# Patient Record
Sex: Male | Born: 1945
Health system: Southern US, Community
[De-identification: ages and names within clinical notes are randomized; demographics above are authoritative.]

## PROBLEM LIST (undated history)

## (undated) DIAGNOSIS — E7889 Other lipoprotein metabolism disorders: Secondary | ICD-10-CM

## (undated) DIAGNOSIS — J45909 Unspecified asthma, uncomplicated: Secondary | ICD-10-CM

## (undated) DIAGNOSIS — R972 Elevated prostate specific antigen [PSA]: Secondary | ICD-10-CM

## (undated) DIAGNOSIS — J309 Allergic rhinitis, unspecified: Secondary | ICD-10-CM

## (undated) DIAGNOSIS — E790 Hyperuricemia without signs of inflammatory arthritis and tophaceous disease: Secondary | ICD-10-CM

## (undated) DIAGNOSIS — E7800 Pure hypercholesterolemia, unspecified: Secondary | ICD-10-CM

## (undated) DIAGNOSIS — I10 Essential (primary) hypertension: Secondary | ICD-10-CM

## (undated) DIAGNOSIS — M109 Gout, unspecified: Secondary | ICD-10-CM

## (undated) DIAGNOSIS — K219 Gastro-esophageal reflux disease without esophagitis: Secondary | ICD-10-CM

## (undated) DIAGNOSIS — K4020 Bilateral inguinal hernia, without obstruction or gangrene, not specified as recurrent: Secondary | ICD-10-CM

## (undated) HISTORY — DX: Gout, unspecified: M10.9

## (undated) HISTORY — DX: Essential (primary) hypertension: I10

## (undated) HISTORY — DX: Gastro-esophageal reflux disease without esophagitis: K21.9

## (undated) HISTORY — DX: Pure hypercholesterolemia, unspecified: E78.00

## (undated) HISTORY — DX: Hyperuricemia without signs of inflammatory arthritis and tophaceous disease: E79.0

## (undated) HISTORY — DX: Other lipoprotein metabolism disorders: E78.89

## (undated) HISTORY — DX: Bilateral inguinal hernia, without obstruction or gangrene, not specified as recurrent: K40.20

## (undated) HISTORY — DX: Allergic rhinitis, unspecified: J30.9

## (undated) HISTORY — DX: Elevated prostate specific antigen (PSA): R97.20

## (undated) HISTORY — DX: Unspecified asthma, uncomplicated: J45.909

---

## 2004-06-12 ENCOUNTER — Ambulatory Visit (HOSPITAL_COMMUNITY): Admission: RE | Admit: 2004-06-12 | Discharge: 2004-06-12 | Payer: Self-pay | Admitting: Gastroenterology

## 2008-12-20 ENCOUNTER — Ambulatory Visit: Admission: RE | Admit: 2008-12-20 | Discharge: 2009-01-05 | Payer: Self-pay | Admitting: Radiation Oncology

## 2009-01-03 ENCOUNTER — Encounter: Admission: RE | Admit: 2009-01-03 | Discharge: 2009-01-03 | Payer: Self-pay | Admitting: Urology

## 2009-01-06 HISTORY — PX: INSERTION PROSTATE RADIATION SEED: SUR718

## 2009-01-10 ENCOUNTER — Ambulatory Visit: Admission: RE | Admit: 2009-01-10 | Discharge: 2009-03-15 | Payer: Self-pay | Admitting: Radiation Oncology

## 2009-02-05 ENCOUNTER — Ambulatory Visit (HOSPITAL_BASED_OUTPATIENT_CLINIC_OR_DEPARTMENT_OTHER): Admission: RE | Admit: 2009-02-05 | Discharge: 2009-02-05 | Payer: Self-pay | Admitting: Urology

## 2010-03-25 LAB — COMPREHENSIVE METABOLIC PANEL
Albumin: 4.2 g/dL (ref 3.5–5.2)
BUN: 19 mg/dL (ref 6–23)
Calcium: 9.4 mg/dL (ref 8.4–10.5)
Chloride: 101 mEq/L (ref 96–112)
Creatinine, Ser: 1.15 mg/dL (ref 0.4–1.5)
GFR calc non Af Amer: >60 mL/min (ref >60)
Total Bilirubin: 1.4 mg/dL — ABNORMAL HIGH (ref 0.3–1.2)

## 2010-03-25 LAB — PROTIME-INR: INR: 1 (ref 0.00–1.49)

## 2010-03-25 LAB — CBC
HCT: 45.6 % (ref 39.0–52.0)
MCHC: 33.8 g/dL (ref 30.0–36.0)
MCV: 96 fL (ref 78.0–100.0)
Platelets: 216 10*3/uL (ref 150–400)
WBC: 6.9 10*3/uL (ref 4.0–10.5)

## 2010-03-25 LAB — APTT: aPTT: 29 seconds (ref 24–37)

## 2010-05-24 NOTE — Op Note (Signed)
NAME:  Mathew Green, Mathew Green NO.:  [id number]   MEDICAL RECORD NO.:  [id number]          PATIENT TYPE:  AMB   LOCATION:  ENDO                         FACILITY:  [facility]   PHYSICIAN:  John C. Madilyn Fireman, M.D.    DATE OF BIRTH:  10/06/1945   DATE OF PROCEDURE:  DATE OF DISCHARGE:                                 OPERATIVE REPORT   INDICATIONS FOR PROCEDURE:  Average risk colon cancer screening.   PROCEDURE:  The patient was placed in the left lateral decubitus position  and placed on the pulse monitor with continuous low-flow oxygen delivered by  nasal cannula.  He was sedated with 75 mcg of IV fentanyl and 5 mg of IV  Versed. Olympus video colonoscope was inserted into the rectum and advanced  to the cecum, confirmed by transillumination at McBurney's point and  visualization of the ileocecal valve and appendiceal orifice. The prep was  good. The cecum and ascending colon appeared normal with no masses, polyps,  diverticula or other mucosal abnormalities. In the transverse, descending  and sigmoid colon, there were significant scattered diverticula, no other  abnormalities.  The rectum appeared normal on retroflexed view.  The anus  revealed no obvious internal hemorrhoids. Scope was then withdrawn and the  patient returned to the recovery room in stable condition. He tolerated the  procedure well and there were no immediate complications.   IMPRESSION:  Diverticulosis, otherwise normal study.   PLAN:  Next colonoscopy within 10 years and consider flexible sigmoidoscopy  and/or Hemoccults in five years.      [facility]/MEDQ  D:  06/12/2004  T:  06/12/2004  Job:  [id number]   cc:   Oley Balm. Georgina Pillion, M.D.  [address]  [place]  [place] [address]  Fax: [phone]

## 2011-01-16 ENCOUNTER — Encounter: Payer: Self-pay | Admitting: *Deleted

## 2011-01-16 NOTE — Progress Notes (Signed)
On  12/15/210 I-PSS results=[id number] score=8

## 2014-02-01 DIAGNOSIS — H25099 Other age-related incipient cataract, unspecified eye: Secondary | ICD-10-CM | POA: Diagnosis not present

## 2014-02-20 ENCOUNTER — Encounter: Payer: Self-pay | Admitting: *Deleted

## 2014-08-09 DIAGNOSIS — K635 Polyp of colon: Secondary | ICD-10-CM | POA: Diagnosis not present

## 2014-08-09 DIAGNOSIS — K5730 Diverticulosis of large intestine without perforation or abscess without bleeding: Secondary | ICD-10-CM | POA: Diagnosis not present

## 2014-08-09 DIAGNOSIS — Z1211 Encounter for screening for malignant neoplasm of colon: Secondary | ICD-10-CM | POA: Diagnosis not present

## 2014-08-09 DIAGNOSIS — K6289 Other specified diseases of anus and rectum: Secondary | ICD-10-CM | POA: Diagnosis not present

## 2014-10-03 DIAGNOSIS — C61 Malignant neoplasm of prostate: Secondary | ICD-10-CM | POA: Diagnosis not present

## 2014-10-10 DIAGNOSIS — Z8546 Personal history of malignant neoplasm of prostate: Secondary | ICD-10-CM | POA: Diagnosis not present

## 2019-02-06 ENCOUNTER — Ambulatory Visit: Payer: Self-pay

## 2019-02-08 ENCOUNTER — Other Ambulatory Visit: Payer: Medicare PPO

## 2019-02-13 ENCOUNTER — Ambulatory Visit: Payer: Medicare PPO | Attending: Internal Medicine

## 2019-02-13 DIAGNOSIS — Z23 Encounter for immunization: Secondary | ICD-10-CM | POA: Insufficient documentation

## 2019-02-13 NOTE — Progress Notes (Signed)
   Covid-19 Vaccination Clinic  Name:  Mathew Green    MRN: [id number] DOB: 08-18-1945  02/13/2019  Mathew Green was observed post Covid-19 immunization for 15 minutes without incidence. He was provided with Vaccine Information Sheet and instruction to access the V-Safe system.   Mathew Green was instructed to call 911 with any severe reactions post vaccine: Marland Kitchen Difficulty breathing  . Swelling of your face and throat  . A fast heartbeat  . A bad rash all over your body  . Dizziness and weakness    Immunizations Administered    Name Date Dose VIS Date Route   Pfizer COVID-19 Vaccine 02/13/2019  8:41 AM 0.3 mL 12/17/2018 Intramuscular   Manufacturer: [employer], [employer]   Lot: [id number]   NDC: [address]-8

## 2019-03-09 ENCOUNTER — Ambulatory Visit: Payer: Medicare PPO | Attending: Internal Medicine

## 2019-03-09 DIAGNOSIS — Z23 Encounter for immunization: Secondary | ICD-10-CM | POA: Insufficient documentation

## 2019-03-09 NOTE — Progress Notes (Signed)
   Covid-19 Vaccination Clinic  Name:  Mathew Green    MRN: [id number] DOB: 08-04-1945  03/09/2019  [name] was observed post Covid-19 immunization for 15 minutes without incident. He was provided with Vaccine Information Sheet and instruction to access the V-Safe system.   [name] was instructed to call 911 with any severe reactions post vaccine: Marland Kitchen Difficulty breathing  . Swelling of face and throat  . A fast heartbeat  . A bad rash all over body  . Dizziness and weakness   Immunizations Administered    Name Date Dose VIS Date Route   Pfizer COVID-19 Vaccine 03/09/2019  3:54 PM 0.3 mL 12/17/2018 Intramuscular   Manufacturer: [employer], [employer]   Lot: [id number]   NDC: [address]-2

## 2019-12-05 DIAGNOSIS — Z8546 Personal history of malignant neoplasm of prostate: Secondary | ICD-10-CM | POA: Diagnosis not present

## 2019-12-12 DIAGNOSIS — N320 Bladder-neck obstruction: Secondary | ICD-10-CM | POA: Diagnosis not present

## 2019-12-12 DIAGNOSIS — Z8546 Personal history of malignant neoplasm of prostate: Secondary | ICD-10-CM | POA: Diagnosis not present

## 2020-05-09 DIAGNOSIS — J4520 Mild intermittent asthma, uncomplicated: Secondary | ICD-10-CM | POA: Diagnosis not present

## 2020-05-09 DIAGNOSIS — M109 Gout, unspecified: Secondary | ICD-10-CM | POA: Diagnosis not present

## 2020-05-09 DIAGNOSIS — J301 Allergic rhinitis due to pollen: Secondary | ICD-10-CM | POA: Diagnosis not present

## 2020-05-09 DIAGNOSIS — Z79899 Other long term (current) drug therapy: Secondary | ICD-10-CM | POA: Diagnosis not present

## 2020-05-09 DIAGNOSIS — E7800 Pure hypercholesterolemia, unspecified: Secondary | ICD-10-CM | POA: Diagnosis not present

## 2020-05-09 DIAGNOSIS — I10 Essential (primary) hypertension: Secondary | ICD-10-CM | POA: Diagnosis not present

## 2020-05-09 DIAGNOSIS — Z0001 Encounter for general adult medical examination with abnormal findings: Secondary | ICD-10-CM | POA: Diagnosis not present

## 2020-05-23 DIAGNOSIS — Z79899 Other long term (current) drug therapy: Secondary | ICD-10-CM | POA: Diagnosis not present

## 2020-06-12 DIAGNOSIS — R310 Gross hematuria: Secondary | ICD-10-CM | POA: Diagnosis not present

## 2020-06-12 DIAGNOSIS — I10 Essential (primary) hypertension: Secondary | ICD-10-CM | POA: Diagnosis not present

## 2020-06-26 DIAGNOSIS — R310 Gross hematuria: Secondary | ICD-10-CM | POA: Diagnosis not present

## 2020-07-06 DIAGNOSIS — N281 Cyst of kidney, acquired: Secondary | ICD-10-CM | POA: Diagnosis not present

## 2020-07-06 DIAGNOSIS — R310 Gross hematuria: Secondary | ICD-10-CM | POA: Diagnosis not present

## 2020-07-06 DIAGNOSIS — N200 Calculus of kidney: Secondary | ICD-10-CM | POA: Diagnosis not present

## 2020-07-13 DIAGNOSIS — I10 Essential (primary) hypertension: Secondary | ICD-10-CM | POA: Diagnosis not present

## 2020-07-23 DIAGNOSIS — H2513 Age-related nuclear cataract, bilateral: Secondary | ICD-10-CM | POA: Diagnosis not present

## 2020-07-23 DIAGNOSIS — H524 Presbyopia: Secondary | ICD-10-CM | POA: Diagnosis not present

## 2020-07-23 DIAGNOSIS — H52223 Regular astigmatism, bilateral: Secondary | ICD-10-CM | POA: Diagnosis not present

## 2020-07-23 DIAGNOSIS — H5203 Hypermetropia, bilateral: Secondary | ICD-10-CM | POA: Diagnosis not present

## 2020-12-26 ENCOUNTER — Other Ambulatory Visit: Payer: Self-pay

## 2020-12-26 ENCOUNTER — Encounter (HOSPITAL_COMMUNITY): Payer: Self-pay

## 2020-12-26 ENCOUNTER — Emergency Department (HOSPITAL_COMMUNITY)
Admission: EM | Admit: 2020-12-26 | Discharge: 2020-12-26 | Disposition: A | Discharge status: Home / Self Care | Payer: Medicare PPO | Attending: Emergency Medicine | Admitting: Emergency Medicine

## 2020-12-26 ENCOUNTER — Emergency Department (HOSPITAL_COMMUNITY): Payer: Medicare PPO

## 2020-12-26 DIAGNOSIS — Z23 Encounter for immunization: Secondary | ICD-10-CM | POA: Insufficient documentation

## 2020-12-26 DIAGNOSIS — I10 Essential (primary) hypertension: Secondary | ICD-10-CM | POA: Diagnosis not present

## 2020-12-26 DIAGNOSIS — Z87891 Personal history of nicotine dependence: Secondary | ICD-10-CM | POA: Diagnosis not present

## 2020-12-26 DIAGNOSIS — J45909 Unspecified asthma, uncomplicated: Secondary | ICD-10-CM | POA: Diagnosis not present

## 2020-12-26 DIAGNOSIS — W010XXA Fall on same level from slipping, tripping and stumbling without subsequent striking against object, initial encounter: Secondary | ICD-10-CM | POA: Diagnosis not present

## 2020-12-26 DIAGNOSIS — S81812A Laceration without foreign body, left lower leg, initial encounter: Secondary | ICD-10-CM

## 2020-12-26 DIAGNOSIS — Z7982 Long term (current) use of aspirin: Secondary | ICD-10-CM | POA: Insufficient documentation

## 2020-12-26 DIAGNOSIS — Z79899 Other long term (current) drug therapy: Secondary | ICD-10-CM | POA: Diagnosis not present

## 2020-12-26 DIAGNOSIS — Z7952 Long term (current) use of systemic steroids: Secondary | ICD-10-CM | POA: Insufficient documentation

## 2020-12-26 DIAGNOSIS — W19XXXA Unspecified fall, initial encounter: Secondary | ICD-10-CM

## 2020-12-26 DIAGNOSIS — S8992XA Unspecified injury of left lower leg, initial encounter: Secondary | ICD-10-CM | POA: Diagnosis present

## 2020-12-26 MED ORDER — CEPHALEXIN 500 MG PO CAPS: 500.0000 mg | ORAL_CAPSULE | Freq: Two times a day (BID) | ORAL | 0 refills | Status: DC

## 2020-12-26 MED ORDER — CEPHALEXIN 500 MG PO CAPS: 500.0000 mg | ORAL_CAPSULE | Freq: Two times a day (BID) | ORAL | 0 refills | Status: AC

## 2020-12-26 MED ORDER — LIDOCAINE-EPINEPHRINE (PF) 2 %-1:200000 IJ SOLN
20.0000 mL | Freq: Once | INTRAMUSCULAR | Status: AC
  Administered 2020-12-26: 20 mL
  Filled 2020-12-26: qty 20

## 2020-12-26 MED ORDER — TETANUS-DIPHTH-ACELL PERTUSSIS 5-2.5-18.5 LF-MCG/0.5 IM SUSY
0.5000 mL | PREFILLED_SYRINGE | Freq: Once | INTRAMUSCULAR | Status: AC
  Administered 2020-12-26: 21:00:00 0.5 mL via INTRAMUSCULAR
  Filled 2020-12-26: qty 0.5

## 2020-12-26 NOTE — ED Triage Notes (Addendum)
Pt reports he tripped over a ledge in a parking garage tonight and presents with large gash to left anterior shin, mild bleeding but is controlled. No blood thinners. Ambulatory. No LOC. No head injury. Denies pain anywhere else.

## 2020-12-26 NOTE — ED Provider Notes (Addendum)
[facility]-EMERGENCY DEPT Provider Note   CSN: [id number] Arrival date & time: 12/26/20  2027     History Chief Complaint  Patient presents with   Mathew Green is a 75 y.o. male with no significant past medical history who presents for evaluation of mechanical fall.  Went to take his leg up and tripped.  Did not actually fall to the ground however did suffer large laceration to left anterior shin.  Mild oozing of blood.  He is not anticoagulated.  He has been ambulatory since.  Denies hitting his head, LOC anticoagulation.  No paresthesias, weakness.  No pain to bilateral femur, knee, ankle or feet.  Rates his pain a 3/10.  Denies additional aggravating or relieving factors  History obtained from patient and past medical records.  No interpreter is used.  HPI     Past Medical History:  Diagnosis Date   Allergic sinusitis    Asthma    Bilateral inguinal hernia    Elevated HDL    Elevated LDL cholesterol level    Elevated PSA    stage T1C, favorable risk adenocarcinoma of the prostate   GERD (gastroesophageal reflux disease)    Gout    Hypertension    Hyperuricemia     There are no problems to display for this patient.   Past Surgical History:  Procedure Laterality Date   INSERTION PROSTATE RADIATION SEED  2011       Family History  Problem Relation Age of Onset   CAD Father    Ovarian cancer Mother    CVA Maternal Grandfather    Healthy Sister     Social History   Tobacco Use   Smoking status: Former    Types: Cigarettes   Smokeless tobacco: Never  Substance Use Topics   Alcohol use: Yes   Drug use: Never    Home Medications Prior to Admission medications   Medication Sig Start Date End Date Taking? Authorizing Provider  albuterol (PROVENTIL HFA;VENTOLIN HFA) 108 (90 BASE) MCG/ACT inhaler Inhale into the lungs every 6 (six) hours as needed for wheezing or shortness of breath.    [provider]  amLODipine  (NORVASC) 2.5 MG tablet Take 2.5 mg by mouth daily.    [provider]  aspirin 81 MG tablet Take 81 mg by mouth daily.    [provider]  atorvastatin (LIPITOR) 10 MG tablet Take 10 mg by mouth daily.    [provider]  cephALEXin (KEFLEX) 500 MG capsule Take 1 capsule (500 mg total) by mouth 2 (two) times daily for 7 days. 12/26/20 01/02/21 Yes Edwards Mckelvie A, PA-C  fexofenadine (ALLEGRA) 180 MG tablet Take 180 mg by mouth daily.    [provider]  fluticasone (FLONASE) 50 MCG/ACT nasal spray Place into both nostrils daily.    [provider]  Fluticasone-Salmeterol (ADVAIR) 100-50 MCG/DOSE AEPB Inhale 1 puff into the lungs 2 (two) times daily.    [provider]  losartan-hydrochlorothiazide (HYZAAR) 100-25 MG per tablet Take 1 tablet by mouth daily.    [provider]  MULTIPLE VITAMIN PO Take by mouth.    [provider]  Omeprazole 20 MG TBEC Take by mouth.    [provider]    Allergies    Pravastatin and Vicodin [hydrocodone-acetaminophen]  Review of Systems   Review of Systems  Constitutional: Negative.   HENT: Negative.    Respiratory: Negative.    Cardiovascular: Negative.   Gastrointestinal:  Negative.   Genitourinary: Negative.   Musculoskeletal:        Left tib fib pain  Skin:  Positive for wound.  Neurological: Negative.   All other systems reviewed and are negative.  Physical Exam Updated Vital Signs BP (!) 175/86 (BP Location: Left Arm)    Pulse 73    Temp 98.1 F (36.7 C) (Oral)    Resp 16    Ht 6\' 2"  (1.88 m)    Wt 88.5 kg    SpO2 97%    BMI 25.04 kg/m   Physical Exam Vitals and nursing note reviewed.  Constitutional:      General: He is not in acute distress.    Appearance: He is well-developed. He is not ill-appearing, toxic-appearing or diaphoretic.  HENT:     Head: Normocephalic and atraumatic.  Eyes:     Pupils: Pupils are equal, round, and reactive to light.   Cardiovascular:     Rate and Rhythm: Normal rate and regular rhythm.     Pulses: Normal pulses.          Dorsalis pedis pulses are 2+ on the right side and 2+ on the left side.       Posterior tibial pulses are 2+ on the right side and 2+ on the left side.     Heart sounds: Normal heart sounds.  Pulmonary:     Effort: Pulmonary effort is normal. No respiratory distress.  Abdominal:     General: There is no distension.     Palpations: Abdomen is soft.  Musculoskeletal:        General: Normal range of motion.     Cervical back: Normal range of motion and neck supple.     Thoracic back: Normal.     Lumbar back: Normal.     Right hip: Normal.     Left hip: Normal.     Right upper leg: Normal.     Left upper leg: Normal.     Right knee: Normal.     Left knee: Normal.     Right lower leg: Normal.     Left lower leg: Laceration and tenderness present. No deformity.     Right ankle: Normal.     Left ankle: Normal.     Right foot: Normal.     Left foot: Normal.       Legs:     Comments: Mild tenderness, midshaft tib-fib.  Non-tender bilateral femur, pelvis, knee, ankle and foot.  No shortening or rotation of leg  Skin:    General: Skin is warm and dry.     Capillary Refill: Capillary refill takes less than 2 seconds.     Comments: 6 cm gaping laceration left midshaft tib-fib.  No pulsatile bleeding.  Neurological:     General: No focal deficit present.     Mental Status: He is alert and oriented to person, place, and time.     Comments: Ambulatory without difficulty Intact sensation Equal strength       ED Results / Procedures / Treatments   Labs (all labs ordered are listed, but only abnormal results are displayed) Labs Reviewed - No data to display  EKG None  Radiology DG Tibia/Fibula Left  Result Date: 12/26/2020 CLINICAL DATA:  Fall, laceration. EXAM: LEFT TIBIA AND FIBULA - 2 VIEW COMPARISON:  None. FINDINGS: There is soft tissue laceration of the mid anterior  lower extremity. There is no radiopaque foreign body. There is no acute fracture or dislocation. Joint spaces are  maintained. IMPRESSION: 1. Soft tissue laceration of the mid lower extremity. No foreign body. 2. No acute fracture or dislocation. Electronically Signed   By: Darliss Cheney M.D.   On: 12/26/2020 21:31    Procedures .Marland KitchenLaceration Repair  Date/Time: 12/26/2020 9:59 PM Performed by: Ralph Leyden A, PA-C Authorized by: Linwood Dibbles, PA-C   Consent:    Consent obtained:  Verbal   Consent given by:  Patient   Risks, benefits, and alternatives were discussed: yes     Risks discussed:  Infection, pain, poor wound healing, poor cosmetic result, need for additional repair, nerve damage, vascular damage, tendon damage and retained foreign body   Alternatives discussed:  No treatment, delayed treatment, observation and referral Universal protocol:    Procedure explained and questions answered to patient or proxy's satisfaction: yes     Relevant documents present and verified: yes     Test results available: yes     Imaging studies available: yes     Required blood products, implants, devices, and special equipment available: yes     Site/side marked: yes     Immediately prior to procedure, a time out was called: yes     Patient identity confirmed:  Verbally with patient Anesthesia:    Anesthesia method:  Local infiltration   Local anesthetic:  Lidocaine 1% WITH epi Laceration details:    Location:  Leg   Leg location:  L lower leg   Length (cm):  7   Depth (mm):  5 Pre-procedure details:    Preparation:  Patient was prepped and draped in usual sterile fashion and imaging obtained to evaluate for foreign bodies Exploration:    Limited defect created (wound extended): no     Hemostasis achieved with:  Direct pressure   Imaging obtained: x-ray     Imaging outcome: foreign body not noted     Wound exploration: wound explored through full range of motion and entire depth  of wound visualized     Wound extent: no foreign bodies/material noted, no muscle damage noted, no nerve damage noted, no tendon damage noted, no underlying fracture noted and no vascular damage noted     Contaminated: no   Treatment:    Area cleansed with:  Povidone-iodine   Amount of cleaning:  Extensive   Irrigation solution:  Sterile saline   Irrigation volume:  2L   Irrigation method:  Pressure wash   Debridement:  None   Undermining:  None Skin repair:    Repair method:  Staples   Number of staples:  13 Approximation:    Approximation:  Close Repair type:    Repair type:  Complex Post-procedure details:    Dressing:  Bulky dressing and non-adherent dressing   Procedure completion:  Tolerated well, no immediate complications   Medications Ordered in ED Medications  Tdap (BOOSTRIX) injection 0.5 mL (0.5 mLs Intramuscular Given 12/26/20 2106)  lidocaine-EPINEPHrine (XYLOCAINE W/EPI) 2 %-1:200000 (PF) injection 20 mL (20 mLs Infiltration Given 12/26/20 2106)   ED Course  I have reviewed the triage vital signs and the nursing notes.  Pertinent labs & imaging results that were available during my care of the patient were reviewed by me and considered in my medical decision making (see chart for details).  Pleasant 75 year old here for evaluation for mechanical fall.  Did not fall to the ground.  He is not in any blood thinners.  Ambulatory.  No paresthesias or weakness.  He has large laceration to left midshaft tib-fib.  No pulsatile  bleeding.  He is neurovascularly intact.  No obvious foreign body.  Will update tetanus.   X-ray shows no underlying fracture, dislocation or foreign object  Wound thoroughly irrigated. # 13 Staples placed. Encouraged wound care, follow-up outpatient, return for new or worsening symptoms.  Given depth, significance of wound did start on Keflex. Patient and wife are agreeable for follow up  No evidence of acute bony, tendon, ligament, vascular  injury.  The patient has been appropriately medically screened and/or stabilized in the ED. I have low suspicion for any other emergent medical condition which would require further screening, evaluation or treatment in the ED or require inpatient management.  Patient is hemodynamically stable and in no acute distress.  Patient able to ambulate in department prior to ED.  Evaluation does not show acute pathology that would require ongoing or additional emergent interventions while in the emergency department or further inpatient treatment.  I have discussed the diagnosis with the patient and answered all questions.  Pain is been managed while in the emergency department and patient has no further complaints prior to discharge.  Patient is comfortable with plan discussed in room and is stable for discharge at this time.  I have discussed strict return precautions for returning to the emergency department.  Patient was encouraged to follow-up with PCP/specialist refer to at discharge.     MDM Rules/Calculators/A&P                             Final Clinical Impression(s) / ED Diagnoses Final diagnoses:  Fall, initial encounter  Laceration of left lower extremity, initial encounter    Rx / DC Orders ED Discharge Orders          Ordered    cephALEXin (KEFLEX) 500 MG capsule  2 times daily        12/26/20 2159               Niles Ess A, PA-C 12/26/20 2216    Lorre Nick, MD 12/30/20 [phone]

## 2020-12-26 NOTE — Discharge Instructions (Addendum)
It was our pleasure taking care of you here in the emergency department today  You had 13 staples placed your leg  As discussed in the room after 24 hours you may remove the bandage  Let warm soapy water run over the wound.  Keep clean and dry.  I started on antibiotics, take as prescribed.  Return for new or worsening symptoms

## 2021-05-31 ENCOUNTER — Other Ambulatory Visit: Payer: Self-pay | Admitting: Family Medicine

## 2021-05-31 ENCOUNTER — Other Ambulatory Visit (HOSPITAL_COMMUNITY): Payer: Self-pay | Admitting: Family Medicine

## 2021-05-31 DIAGNOSIS — R0989 Other specified symptoms and signs involving the circulatory and respiratory systems: Secondary | ICD-10-CM | POA: Diagnosis not present

## 2021-05-31 DIAGNOSIS — Z79899 Other long term (current) drug therapy: Secondary | ICD-10-CM | POA: Diagnosis not present

## 2021-05-31 DIAGNOSIS — E7800 Pure hypercholesterolemia, unspecified: Secondary | ICD-10-CM | POA: Diagnosis not present

## 2021-05-31 DIAGNOSIS — J4520 Mild intermittent asthma, uncomplicated: Secondary | ICD-10-CM | POA: Diagnosis not present

## 2021-05-31 DIAGNOSIS — Z0000 Encounter for general adult medical examination without abnormal findings: Secondary | ICD-10-CM | POA: Diagnosis not present

## 2021-05-31 DIAGNOSIS — I10 Essential (primary) hypertension: Secondary | ICD-10-CM | POA: Diagnosis not present

## 2021-05-31 DIAGNOSIS — I951 Orthostatic hypotension: Secondary | ICD-10-CM | POA: Diagnosis not present

## 2021-05-31 DIAGNOSIS — M109 Gout, unspecified: Secondary | ICD-10-CM | POA: Diagnosis not present

## 2021-05-31 DIAGNOSIS — R7309 Other abnormal glucose: Secondary | ICD-10-CM | POA: Diagnosis not present

## 2021-06-06 ENCOUNTER — Ambulatory Visit (HOSPITAL_COMMUNITY)
Admission: RE | Admit: 2021-06-06 | Discharge: 2021-06-06 | Disposition: A | Discharge status: Home / Self Care | Payer: Medicare PPO | Source: Ambulatory Visit | Attending: Family Medicine | Admitting: Family Medicine

## 2021-06-06 DIAGNOSIS — E7800 Pure hypercholesterolemia, unspecified: Secondary | ICD-10-CM | POA: Insufficient documentation

## 2021-06-18 ENCOUNTER — Ambulatory Visit (HOSPITAL_COMMUNITY)
Admission: RE | Admit: 2021-06-18 | Discharge: 2021-06-18 | Disposition: A | Discharge status: Home / Self Care | Payer: Medicare PPO | Source: Ambulatory Visit | Attending: Family Medicine | Admitting: Family Medicine

## 2021-06-18 DIAGNOSIS — I340 Nonrheumatic mitral (valve) insufficiency: Secondary | ICD-10-CM

## 2021-06-18 DIAGNOSIS — R0989 Other specified symptoms and signs involving the circulatory and respiratory systems: Secondary | ICD-10-CM | POA: Diagnosis not present

## 2021-06-18 DIAGNOSIS — I351 Nonrheumatic aortic (valve) insufficiency: Secondary | ICD-10-CM

## 2021-06-18 DIAGNOSIS — I361 Nonrheumatic tricuspid (valve) insufficiency: Secondary | ICD-10-CM

## 2021-06-18 DIAGNOSIS — I10 Essential (primary) hypertension: Secondary | ICD-10-CM | POA: Diagnosis not present

## 2021-06-18 DIAGNOSIS — I080 Rheumatic disorders of both mitral and aortic valves: Secondary | ICD-10-CM | POA: Diagnosis not present

## 2021-06-18 LAB — ECHOCARDIOGRAM COMPLETE
AR max vel: 2.39 cm2
AV Area VTI: 2.41 cm2
AV Area mean vel: 2.25 cm2
AV Mean grad: 10.5 mmHg
AV Peak grad: 20 mmHg
Ao pk vel: 2.24 m/s
Area-P 1/2: 3.31 cm2
Calc EF: 63.7 %
P 1/2 time: 666 msec
S' Lateral: 3 cm
Single Plane A2C EF: 59.7 %
Single Plane A4C EF: 67.4 %

## 2021-07-12 DIAGNOSIS — Z79899 Other long term (current) drug therapy: Secondary | ICD-10-CM | POA: Diagnosis not present

## 2021-08-02 DIAGNOSIS — N1831 Chronic kidney disease, stage 3a: Secondary | ICD-10-CM | POA: Diagnosis not present

## 2021-08-02 DIAGNOSIS — I10 Essential (primary) hypertension: Secondary | ICD-10-CM | POA: Diagnosis not present

## 2021-08-02 DIAGNOSIS — J4520 Mild intermittent asthma, uncomplicated: Secondary | ICD-10-CM | POA: Diagnosis not present

## 2021-08-02 DIAGNOSIS — Z79899 Other long term (current) drug therapy: Secondary | ICD-10-CM | POA: Diagnosis not present

## 2021-08-02 DIAGNOSIS — M109 Gout, unspecified: Secondary | ICD-10-CM | POA: Diagnosis not present

## 2021-10-01 DIAGNOSIS — H5203 Hypermetropia, bilateral: Secondary | ICD-10-CM | POA: Diagnosis not present

## 2021-10-01 DIAGNOSIS — H2513 Age-related nuclear cataract, bilateral: Secondary | ICD-10-CM | POA: Diagnosis not present

## 2021-10-01 DIAGNOSIS — H52223 Regular astigmatism, bilateral: Secondary | ICD-10-CM | POA: Diagnosis not present

## 2021-10-01 DIAGNOSIS — Z135 Encounter for screening for eye and ear disorders: Secondary | ICD-10-CM | POA: Diagnosis not present

## 2021-10-01 DIAGNOSIS — H524 Presbyopia: Secondary | ICD-10-CM | POA: Diagnosis not present

## 2021-12-19 DIAGNOSIS — H2513 Age-related nuclear cataract, bilateral: Secondary | ICD-10-CM | POA: Diagnosis not present

## 2021-12-19 DIAGNOSIS — H25043 Posterior subcapsular polar age-related cataract, bilateral: Secondary | ICD-10-CM | POA: Diagnosis not present

## 2021-12-19 DIAGNOSIS — H18413 Arcus senilis, bilateral: Secondary | ICD-10-CM | POA: Diagnosis not present

## 2021-12-19 DIAGNOSIS — H35372 Puckering of macula, left eye: Secondary | ICD-10-CM | POA: Diagnosis not present

## 2021-12-19 DIAGNOSIS — H2512 Age-related nuclear cataract, left eye: Secondary | ICD-10-CM | POA: Diagnosis not present

## 2021-12-19 DIAGNOSIS — H25013 Cortical age-related cataract, bilateral: Secondary | ICD-10-CM | POA: Diagnosis not present

## 2022-03-05 DIAGNOSIS — H25812 Combined forms of age-related cataract, left eye: Secondary | ICD-10-CM | POA: Diagnosis not present

## 2022-03-05 DIAGNOSIS — H2512 Age-related nuclear cataract, left eye: Secondary | ICD-10-CM | POA: Diagnosis not present

## 2022-03-06 DIAGNOSIS — H2511 Age-related nuclear cataract, right eye: Secondary | ICD-10-CM | POA: Diagnosis not present

## 2022-03-19 DIAGNOSIS — H25811 Combined forms of age-related cataract, right eye: Secondary | ICD-10-CM | POA: Diagnosis not present

## 2022-03-19 DIAGNOSIS — H2511 Age-related nuclear cataract, right eye: Secondary | ICD-10-CM | POA: Diagnosis not present

## 2022-04-08 DIAGNOSIS — Z961 Presence of intraocular lens: Secondary | ICD-10-CM | POA: Diagnosis not present

## 2022-04-08 DIAGNOSIS — H52223 Regular astigmatism, bilateral: Secondary | ICD-10-CM | POA: Diagnosis not present

## 2022-04-29 DIAGNOSIS — H02055 Trichiasis without entropian left lower eyelid: Secondary | ICD-10-CM | POA: Diagnosis not present

## 2022-04-29 DIAGNOSIS — H02035 Senile entropion of left lower eyelid: Secondary | ICD-10-CM | POA: Diagnosis not present

## 2022-04-29 DIAGNOSIS — H02032 Senile entropion of right lower eyelid: Secondary | ICD-10-CM | POA: Diagnosis not present

## 2022-04-29 DIAGNOSIS — H02045 Spastic entropion of left lower eyelid: Secondary | ICD-10-CM | POA: Diagnosis not present

## 2022-04-29 DIAGNOSIS — H02535 Eyelid retraction left lower eyelid: Secondary | ICD-10-CM | POA: Diagnosis not present

## 2022-04-29 DIAGNOSIS — Z01818 Encounter for other preprocedural examination: Secondary | ICD-10-CM | POA: Diagnosis not present

## 2022-04-29 DIAGNOSIS — H0279 Other degenerative disorders of eyelid and periocular area: Secondary | ICD-10-CM | POA: Diagnosis not present

## 2022-06-16 DIAGNOSIS — H02055 Trichiasis without entropian left lower eyelid: Secondary | ICD-10-CM | POA: Diagnosis not present

## 2022-06-16 DIAGNOSIS — H02045 Spastic entropion of left lower eyelid: Secondary | ICD-10-CM | POA: Diagnosis not present

## 2022-06-16 DIAGNOSIS — H0279 Other degenerative disorders of eyelid and periocular area: Secondary | ICD-10-CM | POA: Diagnosis not present

## 2022-06-16 DIAGNOSIS — H02535 Eyelid retraction left lower eyelid: Secondary | ICD-10-CM | POA: Diagnosis not present

## 2022-06-16 DIAGNOSIS — H02032 Senile entropion of right lower eyelid: Secondary | ICD-10-CM | POA: Diagnosis not present

## 2022-06-16 DIAGNOSIS — H02035 Senile entropion of left lower eyelid: Secondary | ICD-10-CM | POA: Diagnosis not present

## 2022-06-17 DIAGNOSIS — R7301 Impaired fasting glucose: Secondary | ICD-10-CM | POA: Diagnosis not present

## 2022-06-17 DIAGNOSIS — Z79899 Other long term (current) drug therapy: Secondary | ICD-10-CM | POA: Diagnosis not present

## 2022-06-17 DIAGNOSIS — Z0001 Encounter for general adult medical examination with abnormal findings: Secondary | ICD-10-CM | POA: Diagnosis not present

## 2022-06-17 DIAGNOSIS — M109 Gout, unspecified: Secondary | ICD-10-CM | POA: Diagnosis not present

## 2022-06-17 DIAGNOSIS — E7800 Pure hypercholesterolemia, unspecified: Secondary | ICD-10-CM | POA: Diagnosis not present

## 2022-06-17 DIAGNOSIS — J4520 Mild intermittent asthma, uncomplicated: Secondary | ICD-10-CM | POA: Diagnosis not present

## 2022-06-17 DIAGNOSIS — I10 Essential (primary) hypertension: Secondary | ICD-10-CM | POA: Diagnosis not present

## 2022-06-17 DIAGNOSIS — N1831 Chronic kidney disease, stage 3a: Secondary | ICD-10-CM | POA: Diagnosis not present

## 2022-06-17 DIAGNOSIS — R0989 Other specified symptoms and signs involving the circulatory and respiratory systems: Secondary | ICD-10-CM | POA: Diagnosis not present

## 2022-07-14 DIAGNOSIS — Z79899 Other long term (current) drug therapy: Secondary | ICD-10-CM | POA: Diagnosis not present

## 2022-12-17 DIAGNOSIS — Z79899 Other long term (current) drug therapy: Secondary | ICD-10-CM | POA: Diagnosis not present

## 2022-12-17 DIAGNOSIS — R053 Chronic cough: Secondary | ICD-10-CM | POA: Diagnosis not present

## 2022-12-17 DIAGNOSIS — R7301 Impaired fasting glucose: Secondary | ICD-10-CM | POA: Diagnosis not present

## 2022-12-17 DIAGNOSIS — I10 Essential (primary) hypertension: Secondary | ICD-10-CM | POA: Diagnosis not present

## 2022-12-17 DIAGNOSIS — M109 Gout, unspecified: Secondary | ICD-10-CM | POA: Diagnosis not present

## 2022-12-17 DIAGNOSIS — E7800 Pure hypercholesterolemia, unspecified: Secondary | ICD-10-CM | POA: Diagnosis not present

## 2022-12-17 DIAGNOSIS — J4520 Mild intermittent asthma, uncomplicated: Secondary | ICD-10-CM | POA: Diagnosis not present

## 2022-12-17 DIAGNOSIS — N1831 Chronic kidney disease, stage 3a: Secondary | ICD-10-CM | POA: Diagnosis not present

## 2022-12-17 DIAGNOSIS — I2721 Secondary pulmonary arterial hypertension: Secondary | ICD-10-CM | POA: Diagnosis not present

## 2022-12-19 DIAGNOSIS — R053 Chronic cough: Secondary | ICD-10-CM | POA: Diagnosis not present

## 2023-06-18 DIAGNOSIS — Z1331 Encounter for screening for depression: Secondary | ICD-10-CM | POA: Diagnosis not present

## 2023-06-18 DIAGNOSIS — Z0000 Encounter for general adult medical examination without abnormal findings: Secondary | ICD-10-CM | POA: Diagnosis not present

## 2023-06-18 DIAGNOSIS — R0989 Other specified symptoms and signs involving the circulatory and respiratory systems: Secondary | ICD-10-CM | POA: Diagnosis not present

## 2023-06-18 DIAGNOSIS — E7800 Pure hypercholesterolemia, unspecified: Secondary | ICD-10-CM | POA: Diagnosis not present

## 2023-06-18 DIAGNOSIS — M109 Gout, unspecified: Secondary | ICD-10-CM | POA: Diagnosis not present

## 2023-06-18 DIAGNOSIS — R7301 Impaired fasting glucose: Secondary | ICD-10-CM | POA: Diagnosis not present

## 2023-06-18 DIAGNOSIS — N1831 Chronic kidney disease, stage 3a: Secondary | ICD-10-CM | POA: Diagnosis not present

## 2023-06-18 DIAGNOSIS — I10 Essential (primary) hypertension: Secondary | ICD-10-CM | POA: Diagnosis not present

## 2023-06-18 DIAGNOSIS — Z79899 Other long term (current) drug therapy: Secondary | ICD-10-CM | POA: Diagnosis not present

## 2023-06-18 DIAGNOSIS — J4520 Mild intermittent asthma, uncomplicated: Secondary | ICD-10-CM | POA: Diagnosis not present

## 2023-09-13 IMAGING — CR DG TIBIA/FIBULA 2V*L*
4 series · 4 of 4 positions shown · non-contrast
Comparison: None.

CLINICAL DATA: Fall, laceration.

EXAM:
LEFT TIBIA AND FIBULA - 2 VIEW

[x tib-fib ap left (1 of 2)]
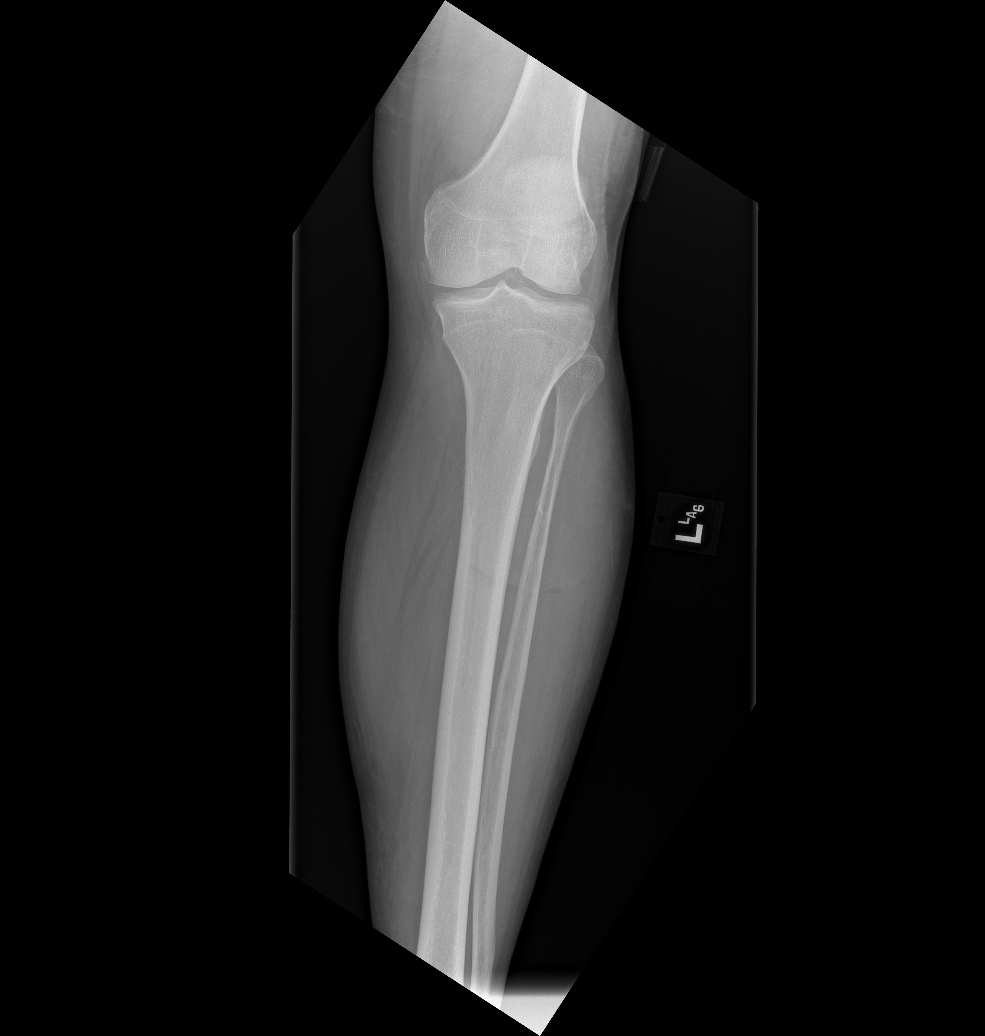

[x tib-fib ap left (2 of 2)]
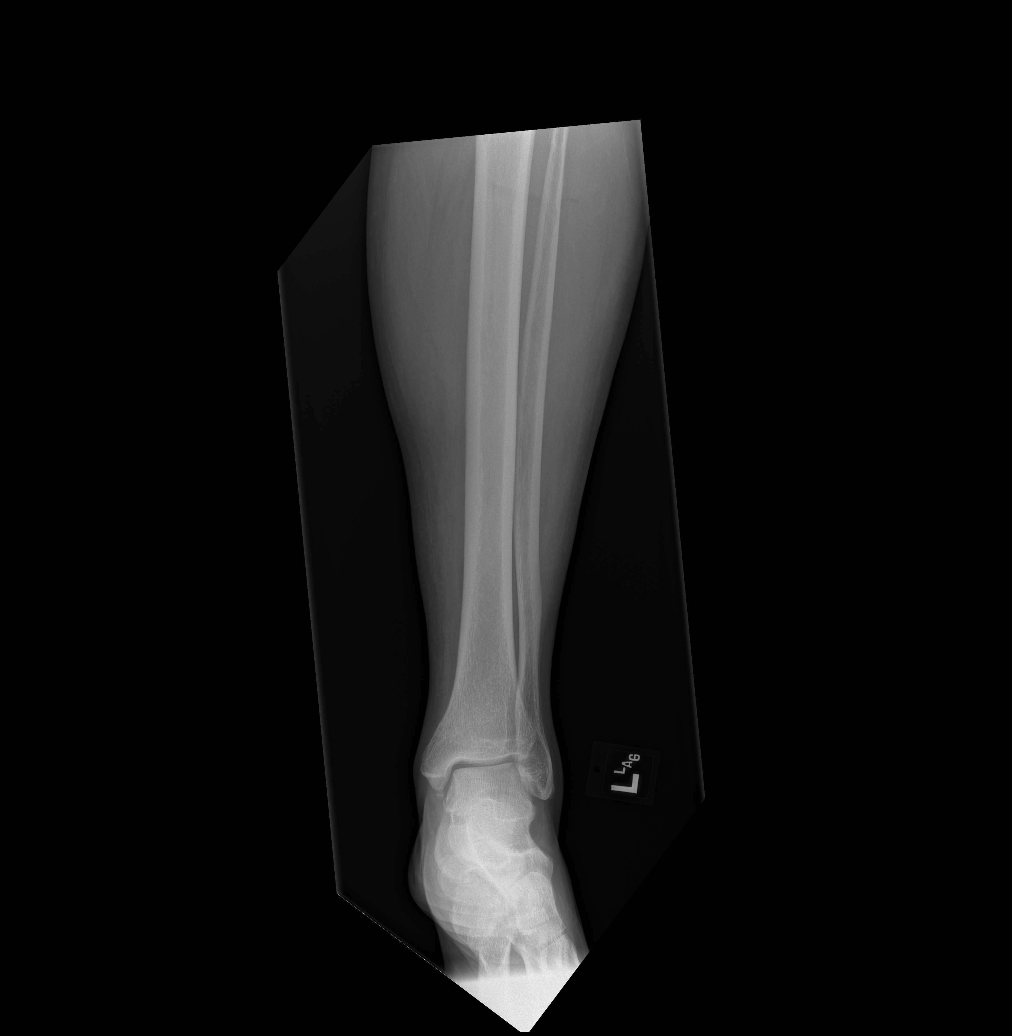

[x tib-fib lat left (1 of 2)]
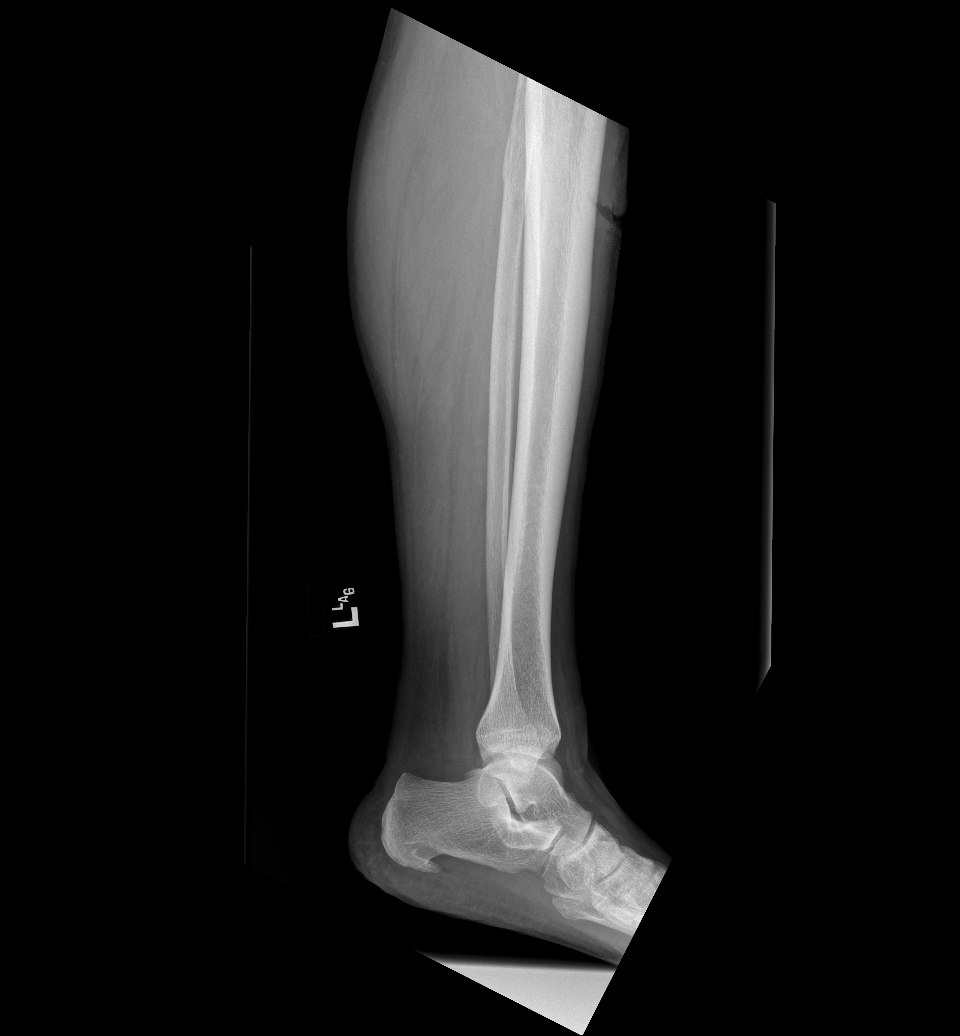

[x tib-fib lat left (2 of 2)]
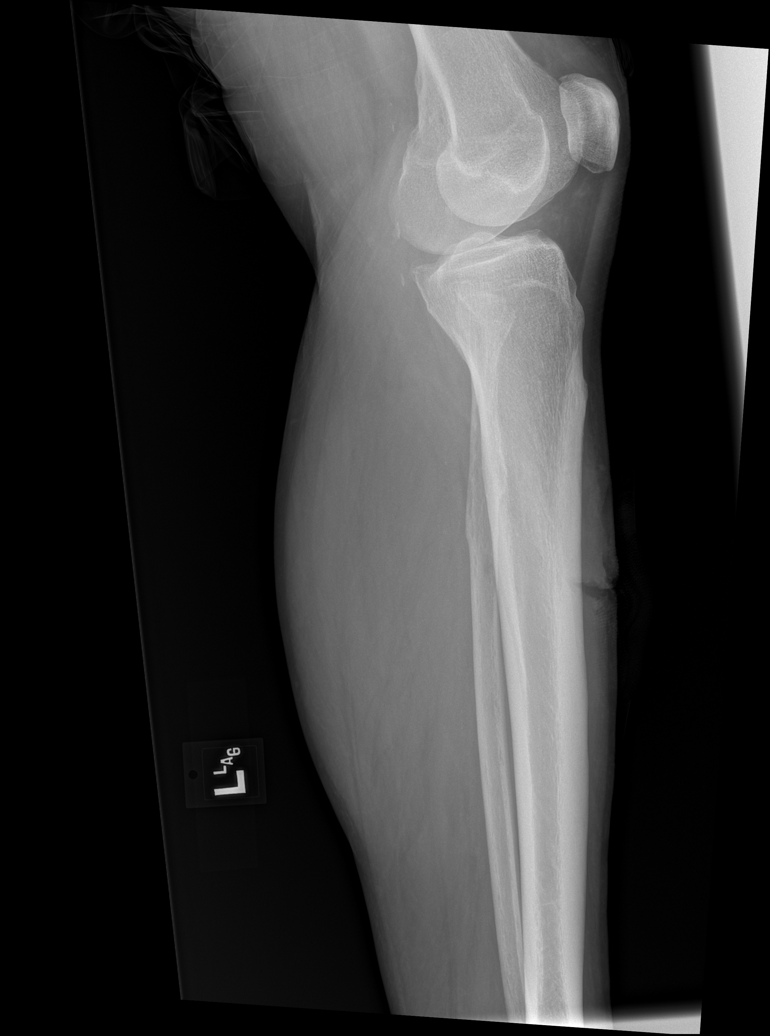

[4 of 4 positions shown; findings below may reference images not displayed]

FINDINGS: There is soft tissue laceration of the mid anterior lower extremity.
There is no radiopaque foreign body. There is no acute fracture or
dislocation. Joint spaces are maintained.
IMPRESSION: 1. Soft tissue laceration of the mid lower extremity. No foreign
body.
2. No acute fracture or dislocation.

## 2023-09-16 ENCOUNTER — Encounter: Payer: Self-pay | Admitting: Pulmonary Disease

## 2023-09-16 ENCOUNTER — Ambulatory Visit: Admitting: Pulmonary Disease

## 2023-09-16 VITALS — BP 130/62 | HR 77 | Ht 73.0 in | Wt 206.0 lb

## 2023-09-16 DIAGNOSIS — F17211 Nicotine dependence, cigarettes, in remission: Secondary | ICD-10-CM

## 2023-09-16 DIAGNOSIS — R053 Chronic cough: Secondary | ICD-10-CM

## 2023-09-16 DIAGNOSIS — J4540 Moderate persistent asthma, uncomplicated: Secondary | ICD-10-CM | POA: Insufficient documentation

## 2023-09-16 NOTE — Progress Notes (Signed)
 @Patient  ID: Mathew Green, male    DOB: 1945-06-14, 78 y.o.   MRN: [id number]  Chief Complaint  Patient presents with   Consult    Pt states Hx of asthma / x1 year cc w/ congestion. Currently using trelegy inh      Referring provider: Regino Slater, MD  HPI:   78 y.o. man whom we are seeing for evaluation of chronic cough with history of asthma.  Most recent PCP note reviewed.  Patient reports longstanding history of asthma.  Usually well-controlled with ICS/LABA therapy.  Over the last year or so he has had worsening cough.  Productive.  Relatively well-managed but more frequent and more bothersome.  His Wixela was escalated to Trelegy via his PCP.  He has noted marked improvement in quantity and quality of sputum.  Less frequent less mucus production at [facility].  In addition, he is added to flutter valve twice a day.  This helps loosen mucus and decrease his mucus burden throughout the day.  He denies any shortness of breath or dyspnea on exertion.  He plays wood wind instruments, no issues with playing instruments etc.  He is a former smoker but quit many many years ago.  Most recent chest imaging I can view CT coronary scan 06/2021 with very mild emphysematous changes, thickened bronchioles throughout on my review and interpretation.  No significant fibrosis or signs of ILD.  Questionaires / Pulmonary Flowsheets:   ACT:      No data to display          MMRC:     No data to display          Epworth:      No data to display          Tests:   FENO:  No results found for: NITRICOXIDE  PFT:     No data to display          WALK:      No data to display          Imaging: Personally reviewed and as per EMR and discussion in this note No results found.  Lab Results: Personally reviewed including labs via Care Everywhere CBC    Component Value Date/Time   WBC 6.9 01/29/2009 0950   RBC 4.75 01/29/2009 0950   HGB 15.4 01/29/2009 0950   HCT  45.6 01/29/2009 0950   PLT 216 01/29/2009 0950   MCV 96.0 01/29/2009 0950   MCHC 33.8 01/29/2009 0950   RDW 13.2 01/29/2009 0950    BMET    Component Value Date/Time   NA 137 01/29/2009 0950   K 4.8 01/29/2009 0950   CL 101 01/29/2009 0950   CO2 31 01/29/2009 0950   GLUCOSE 102 (H) 01/29/2009 0950   BUN 19 01/29/2009 0950   CREATININE 1.15 01/29/2009 0950   CALCIUM 9.4 01/29/2009 0950   GFRNONAA >60 01/29/2009 0950   GFRAA  01/29/2009 0950    >60        The eGFR has been calculated using the MDRD equation. This calculation has not been validated in all clinical situations. eGFR's persistently <60 mL/min signify possible Chronic Kidney Disease.    BNP No results found for: BNP  ProBNP No results found for: PROBNP  Specialty Problems   None   Allergies  Allergen Reactions   Pravastatin     Joint soreness   Vicodin [Hydrocodone-Acetaminophen]     constipation    Immunization History  Administered Date(s) Administered  PFIZER(Purple Top)SARS-COV-2 Vaccination 02/13/2019, 03/09/2019   Tdap 12/26/2020    Past Medical History:  Diagnosis Date   Allergic sinusitis    Asthma    Bilateral inguinal hernia    Elevated HDL    Elevated LDL cholesterol level    Elevated PSA    stage T1C, favorable risk adenocarcinoma of the prostate   GERD (gastroesophageal reflux disease)    Gout    Hypertension    Hyperuricemia     Tobacco History: Social History   Tobacco Use  Smoking Status Former   Types: Cigarettes  Smokeless Tobacco Never   Counseling given: Not Answered   Continue to not smoke  Outpatient Encounter Medications as of 09/16/2023  Medication Sig   albuterol (PROVENTIL HFA;VENTOLIN HFA) 108 (90 BASE) MCG/ACT inhaler Inhale into the lungs every 6 (six) hours as needed for wheezing or shortness of breath.   amLODipine (NORVASC) 2.5 MG tablet Take 2.5 mg by mouth daily.   aspirin 81 MG tablet Take 81 mg by mouth daily.   atorvastatin  (LIPITOR) 10 MG tablet Take 10 mg by mouth daily.   fexofenadine (ALLEGRA) 180 MG tablet Take 180 mg by mouth daily.   fluticasone (FLONASE) 50 MCG/ACT nasal spray Place into both nostrils daily.   losartan-hydrochlorothiazide (HYZAAR) 100-25 MG per tablet Take 1 tablet by mouth daily.   MULTIPLE VITAMIN PO Take by mouth.   Omeprazole 20 MG TBEC Take by mouth.   TRELEGY ELLIPTA 200-62.5-25 MCG/ACT AEPB Inhale 1 puff into the lungs daily.   [DISCONTINUED] Fluticasone-Salmeterol (ADVAIR) 100-50 MCG/DOSE AEPB Inhale 1 puff into the lungs 2 (two) times daily. (Patient not taking: Reported on 09/16/2023)   No facility-administered encounter medications on file as of 09/16/2023.     Review of Systems  Review of Systems  No chest pain exertion.  No orthopnea or PND.  Comprehensive review of system otherwise negative. Physical Exam  BP 130/62   Pulse 77   Ht 6' 1 (1.854 m)   Wt 206 lb (93.4 kg)   SpO2 96%   BMI 27.18 kg/m   Wt Readings from Last 5 Encounters:  09/16/23 206 lb (93.4 kg)  12/26/20 195 lb (88.5 kg)    BMI Readings from Last 5 Encounters:  09/16/23 27.18 kg/m  12/26/20 25.04 kg/m     Physical Exam General: Sitting in chair, no acute distress Eyes: EOMI, no icterus Neck: Supple, no JVP Pulmonary: Clear, no work of breathing Cardiovascular: Regular rate and rhythm, no murmur Abdomen: Nondistended Neuro: Normal gait, no weakness Psych: Normal mood, full affect   Assessment & Plan:   Chronic cough: Felt to be multifactorial most related to asthma symptoms given improvement with Trelegy.  Also likely some component of seasonal allergies and postnasal drip given seasonal changes etc.  Currently improved.  Chest imaging reportedly clear.  Most recent chest CT 2023 with thickened bronchioles consistent with asthma or developing chronic bronchitis in the setting of his remote tobacco use.  Continue Trelegy high-dose 1 puff once daily.  Continue albuterol as needed for  cough or shortness of breath, he reports rare albuterol use.  Tobacco abuse in remission: Remote smoking history.  Not a candidate for lung cancer screening.  Encouraged ongoing abstinence from tobacco.   Return in about 1 year (around 09/15/2024) for f/u Dr. Annella.   Donnice JONELLE Annella, MD 09/16/2023

## 2023-09-16 NOTE — Patient Instructions (Signed)
 Great to see you!  No changes to medication!  Continue Trelegy 1 puff once a day - rinse mouth after use  Continue the albuterol as need for cough or shortness of breath  Return to clinic in 1 year or sooner as needed

## 2023-10-29 DIAGNOSIS — Z23 Encounter for immunization: Secondary | ICD-10-CM | POA: Diagnosis not present
